# Patient Record
Sex: Female | Born: 1990 | Race: White | Hispanic: No | Marital: Married | Smoking: Never smoker
Health system: Southern US, Community
[De-identification: ages and names within clinical notes are randomized; demographics above are authoritative.]

## PROBLEM LIST (undated history)

## (undated) ENCOUNTER — Inpatient Hospital Stay (HOSPITAL_COMMUNITY): Payer: Self-pay

---

## 2013-04-09 HISTORY — PX: WISDOM TOOTH EXTRACTION: SHX21

## 2018-06-09 ENCOUNTER — Encounter (HOSPITAL_COMMUNITY): Payer: Self-pay | Admitting: *Deleted

## 2018-06-09 ENCOUNTER — Inpatient Hospital Stay (HOSPITAL_COMMUNITY): Payer: 59

## 2018-06-09 ENCOUNTER — Inpatient Hospital Stay (HOSPITAL_COMMUNITY)
Admission: AD | Admit: 2018-06-09 | Discharge: 2018-06-09 | Disposition: A | Discharge status: Home / Self Care | Payer: 59 | Attending: Obstetrics and Gynecology | Admitting: Obstetrics and Gynecology

## 2018-06-09 ENCOUNTER — Other Ambulatory Visit: Payer: Self-pay

## 2018-06-09 DIAGNOSIS — O26891 Other specified pregnancy related conditions, first trimester: Secondary | ICD-10-CM

## 2018-06-09 DIAGNOSIS — R109 Unspecified abdominal pain: Secondary | ICD-10-CM

## 2018-06-09 DIAGNOSIS — O209 Hemorrhage in early pregnancy, unspecified: Secondary | ICD-10-CM | POA: Diagnosis present

## 2018-06-09 DIAGNOSIS — O021 Missed abortion: Secondary | ICD-10-CM | POA: Insufficient documentation

## 2018-06-09 DIAGNOSIS — Z3A01 Less than 8 weeks gestation of pregnancy: Secondary | ICD-10-CM

## 2018-06-09 LAB — URINALYSIS, ROUTINE W REFLEX MICROSCOPIC
Bilirubin Urine: NEGATIVE
Glucose, UA: NEGATIVE mg/dL
Ketones, ur: NEGATIVE mg/dL
Nitrite: NEGATIVE
PH: 7 (ref 5.0–8.0)
Protein, ur: NEGATIVE mg/dL
Specific Gravity, Urine: 1.009 (ref 1.005–1.030)

## 2018-06-09 LAB — CBC WITH DIFFERENTIAL/PLATELET
Abs Immature Granulocytes: 0.03 10*3/uL (ref 0.00–0.07)
BASOS PCT: 0 %
Basophils Absolute: 0 10*3/uL (ref 0.0–0.1)
Eosinophils Absolute: 0.1 10*3/uL (ref 0.0–0.5)
Eosinophils Relative: 1 %
HCT: 43.1 % (ref 36.0–46.0)
Hemoglobin: 14.3 g/dL (ref 12.0–15.0)
Immature Granulocytes: 0 %
Lymphocytes Relative: 34 %
Lymphs Abs: 3.2 10*3/uL (ref 0.7–4.0)
MCH: 29.5 pg (ref 26.0–34.0)
MCHC: 33.2 g/dL (ref 30.0–36.0)
MCV: 89 fL (ref 80.0–100.0)
Monocytes Absolute: 0.6 10*3/uL (ref 0.1–1.0)
Monocytes Relative: 6 %
Neutro Abs: 5.6 10*3/uL (ref 1.7–7.7)
Neutrophils Relative %: 59 %
Platelets: 360 10*3/uL (ref 150–400)
RBC: 4.84 MIL/uL (ref 3.87–5.11)
RDW: 12.7 % (ref 11.5–15.5)
WBC: 9.5 10*3/uL (ref 4.0–10.5)
nRBC: 0 % (ref 0.0–0.2)

## 2018-06-09 LAB — WET PREP, GENITAL
Clue Cells Wet Prep HPF POC: NONE SEEN
SPERM: NONE SEEN
Trich, Wet Prep: NONE SEEN
Yeast Wet Prep HPF POC: NONE SEEN

## 2018-06-09 LAB — ABO/RH: ABO/RH(D): O POS

## 2018-06-09 LAB — POCT PREGNANCY, URINE: Preg Test, Ur: POSITIVE — AB

## 2018-06-09 LAB — HCG, QUANTITATIVE, PREGNANCY: hCG, Beta Chain, Quant, S: 14909 m[IU]/mL — ABNORMAL HIGH (ref <5)

## 2018-06-09 MED ORDER — PROMETHAZINE HCL 25 MG PO TABS: 12.5000 mg | ORAL_TABLET | Freq: Four times a day (QID) | ORAL | 0 refills | Status: DC | PRN

## 2018-06-09 MED ORDER — OXYCODONE-ACETAMINOPHEN 5-325 MG PO TABS: 1.0000 | ORAL_TABLET | ORAL | 0 refills | Status: AC | PRN

## 2018-06-09 MED ORDER — MISOPROSTOL 200 MCG PO TABS: ORAL_TABLET | ORAL | 1 refills | Status: DC

## 2018-06-09 MED ORDER — IBUPROFEN 600 MG PO TABS: 600.0000 mg | ORAL_TABLET | Freq: Four times a day (QID) | ORAL | 0 refills | Status: DC | PRN

## 2018-06-09 NOTE — MAU Note (Signed)
Pt reports positive preg test, spotting for the last 3 days. Some discomfort but not pain.

## 2018-06-09 NOTE — Discharge Instructions (Signed)
Care After Cytotec This sheet gives you information about how to care for yourself. Your health care provider may also give you more specific instructions. If you have problems or questions, contact your health care provider. What can I expect ? After the procedure, it is common to have:  Bleeding that lasts for a few hours or a few days. It may feel like you are having a heavy menstrual period.  A headache.  Diarrhea.  Nausea and vomiting.  Chills.  Dizziness. Your next period will most likely start 4-6 weeks after the procedure, unless you start taking birth control pills. Follow these instructions at home: Medicines   Take over-the-counter and prescription medicines only as told by your health care provider.  Only take the medicines your health care provider recommends. Do not take aspirin. It can cause bleeding. Activity  Do not have sex for 2-3 weeks or until your health care provider approves.  Rest and avoid activity that requires a lot of energy for 2-3 weeks.  Do not drive or use heavy machinery while taking prescription pain medicine. General instructions  There will be bleeding after the procedure. It is recommended that you: ? Write down how many menstrual pads you use each day and how soaked they are. This could be useful information for your health care provider. ? Check for any large blood clots or tissue when you change your menstrual pad. If you pass tissue, save the tissue to show to your health care provider.  Do not douche or use tampons until your health care provider approves.  Ask your health care provider when you can start using hormonal birth control (contraception).  Keep all follow-up visits as told by your health care provider. This is important. Contact a health care provider if:  You have chills or a fever.  You have pain that is not relieved by prescription pain medicine.  You have a bad-smelling vaginal discharge.  You have pain or  bleeding that gets worse instead of better.  You have any of the following for more than 24 hours: ? Nausea. ? Vomiting. ? Diarrhea. Get help right away if:  You have severe cramps in your stomach, back, or abdomen.  You pass large blood clots or tissue out of your vagina. Save any tissue for your health care provider to inspect.  You need to change your pad more than once in an hour.  You become light-headed, weak, or faint. Summary  It is common to have bleeding, headache, diarrhea, nausea and vomiting, chills, and dizziness.  Take over-the-counter and prescription medicines only as told by your health care provider.  Do not have sex for 2-3 weeks or until your health care provider approves.  When you are bleeding after the procedure, write down how many menstrual pads you use each day and how soaked they are.  Keep all follow-up visits as told by your health care provider. This is important. This information is not intended to replace advice given to you by your health care provider. Make sure you discuss any questions you have with your health care provider. Document Released: 03/31/2013 Document Revised: 06/13/2016 Document Reviewed: 06/13/2016 Elsevier Interactive Patient Education  2019 [employer].

## 2018-06-09 NOTE — MAU Provider Note (Addendum)
History     CSN: [id number]  Arrival date and time: 06/09/18 1107   First Provider Initiated Contact with Patient 06/09/18 1253     History obtained from patient.  Chief Complaint  Patient presents with  . Possible Pregnancy  . Vaginal Bleeding   HPI   Virginia Lewis [id number] is a 28 year old female that presents to MAU with a chief complaint of vaginal bleeding and mild abdominal cramping in LLQ that started on Saturday night. Patient reports that bleeding started off as dark brown, similar to "old blood," and then progressed to a light pink color. States that bleeding is less than her typical menstrual cycles. Last night at 11:00 pm she noticed 3 clots that were "the size of peas." Her bleeding improved, and then she noticed it again this morning when she wiped. Abdominal pain is rated 0.5/10 and is located on the left side. Patient has not taken anything for pain. LMP was on 04/29/2018. Unknown IUP. Denies discharge and dysuria. Denies history of STD. Denies history of abdominal surgeries. Patient is taking prenatal vitamins, and has an appointment scheduled for prenatal care on Wednesday.   History reviewed. No pertinent past medical history.  History reviewed. No pertinent surgical history.  History reviewed. No pertinent family history.  Social History   Tobacco Use  . Smoking status: Never Smoker  . Smokeless tobacco: Never Used  Substance Use Topics  . Alcohol use: Not Currently  . Drug use: Never    Allergies: No Known Allergies  No medications prior to admission.    Review of Systems  Respiratory: Negative for shortness of breath.   Cardiovascular: Negative for chest pain and leg swelling.  Gastrointestinal: Positive for abdominal pain (LLQ).  Genitourinary: Positive for vaginal bleeding. Negative for dysuria, flank pain, pelvic pain and vaginal pain.  Neurological: Negative for headaches.   Physical Exam   Blood pressure (!) 140/92, pulse (!) 104, temperature  98.4 F (36.9 C), temperature source Oral, resp. rate 16, height 5\' 3"  (1.6 m), weight 108.4 kg, last menstrual period 04/29/2018, SpO2 100 %.  Physical Exam  Constitutional: She is oriented to person, place, and time. She appears well-developed and well-nourished.  HENT:  Head: Normocephalic and atraumatic.  Neck: Normal range of motion. Neck supple.  Cardiovascular: Normal rate, regular rhythm and normal heart sounds.  Respiratory: Effort normal and breath sounds normal.  GI: Soft. Bowel sounds are normal. She exhibits no distension. There is no abdominal tenderness. There is no rebound and no guarding.  Neurological: She is alert and oriented to person, place, and time.  Skin: Skin is warm and dry.    Shawnie Dapper PA-S2  06/09/2018, 1:13 PM  I personally was present during the history, physical exam and medical decision-making activities of this service and have verified that the service and findings are accurately documented in the student's note.   Mallie Snooks, CNM 06/09/18  2:38 PM    MAU Course/MDM  Procedures: sterile speculum exam GU and physical assessment performed by me  --Crown rump length of 7.29mm with no cardiac activity  --Discussed radiology report with Dr. Durenda Guthrie. He agrees patient meets criteria for failed pregnancy (per ACOG criteria below) and will amend his final report to reflect this diagnosis  --Greater than 20 minutes spent at bedside discussing options for treatment including expectant management, Cytotec in MAU and Cytotec at home. Patient verbalizes strong preference for Cytotec at home after calling her husband. CNM reviewed expectations for timing and amount of bleeding  s/p Cytotec  --Chaplain declined by patient    Early Intrauterine Pregnancy Failure  _X__  Documented intrauterine pregnancy failure less than or equal to [redacted] weeks gestation  _X__  No serious current illness  _X_  Baseline Hgb greater than or equal to 10g/dl  _X__   Patient has easily accessible transportation to the hospital  _X__  Clear preference  _X__  Practitioner/physician deems patient reliable  _X__  Counseling by practitioner or physician  _X__  Patient education by RN  N/A  Rho-Gam given by RN if indicated (A POS)  The following medications have been electronically sent to patient's pharmacy  _X_   Cytotec 800 mcg to pharmacy, buccal or per vagina at home  _X__  Ibuprofen 600 mg 1 tablet by mouth every 6 hours as needed #30  _X_  Hydrocodone/acetaminophen 5/325 mg by mouth every 4 to 6 hours as needed  _X_  Phenergan 12.5 mg by mouth every 4 hours as needed for nausea  Results for orders placed or performed during the hospital encounter of 06/09/18 (from the past 24 hour(s))  Pregnancy, urine POC     Status: Abnormal   Collection Time: 06/09/18 12:07 PM  Result Value Ref Range   Preg Test, Ur POSITIVE (A) NEGATIVE  Urinalysis, Routine w reflex microscopic     Status: Abnormal   Collection Time: 06/09/18 12:11 PM  Result Value Ref Range   Color, Urine YELLOW YELLOW   APPearance HAZY (A) CLEAR   Specific Gravity, Urine 1.009 1.005 - 1.030   pH 7.0 5.0 - 8.0   Glucose, UA NEGATIVE NEGATIVE mg/dL   Hgb urine dipstick LARGE (A) NEGATIVE   Bilirubin Urine NEGATIVE NEGATIVE   Ketones, ur NEGATIVE NEGATIVE mg/dL   Protein, ur NEGATIVE NEGATIVE mg/dL   Nitrite NEGATIVE NEGATIVE   Leukocytes,Ua LARGE (A) NEGATIVE   RBC / HPF 0-5 0 - 5 RBC/hpf   WBC, UA 21-50 0 - 5 WBC/hpf   Bacteria, UA RARE (A) NONE SEEN   Squamous Epithelial / LPF 11-20 0 - 5  Wet prep, genital     Status: Abnormal   Collection Time: 06/09/18  1:12 PM  Result Value Ref Range   Yeast Wet Prep HPF POC NONE SEEN NONE SEEN   Trich, Wet Prep NONE SEEN NONE SEEN   Clue Cells Wet Prep HPF POC NONE SEEN NONE SEEN   WBC, Wet Prep HPF POC MANY (A) NONE SEEN   Sperm NONE SEEN   CBC with Differential/Platelet     Status: None   Collection Time: 06/09/18  1:29 PM   Result Value Ref Range   WBC 9.5 4.0 - 10.5 K/uL   RBC 4.84 3.87 - 5.11 MIL/uL   Hemoglobin 14.3 12.0 - 15.0 g/dL   HCT 43.1 36.0 - 46.0 %   MCV 89.0 80.0 - 100.0 fL   MCH 29.5 26.0 - 34.0 pg   MCHC 33.2 30.0 - 36.0 g/dL   RDW 12.7 11.5 - 15.5 %   Platelets 360 150 - 400 K/uL   nRBC 0.0 0.0 - 0.2 %   Neutrophils Relative % 59 %   Neutro Abs 5.6 1.7 - 7.7 K/uL   Lymphocytes Relative 34 %   Lymphs Abs 3.2 0.7 - 4.0 K/uL   Monocytes Relative 6 %   Monocytes Absolute 0.6 0.1 - 1.0 K/uL   Eosinophils Relative 1 %   Eosinophils Absolute 0.1 0.0 - 0.5 K/uL   Basophils Relative 0 %   Basophils Absolute 0.0  0.0 - 0.1 K/uL   Immature Granulocytes 0 %   Abs Immature Granulocytes 0.03 0.00 - 0.07 K/uL  hCG, quantitative, pregnancy     Status: Abnormal   Collection Time: 06/09/18  1:29 PM  Result Value Ref Range   hCG, Beta Chain, Quant, S 14,909 (H) <5 mIU/mL  ABO/Rh     Status: None   Collection Time: 06/09/18  1:29 PM  Result Value Ref Range   ABO/RH(D)      O POS Performed at [facility] [address] [place], [place] [address]      Assessment and Plan  --Virginia Lewis 28 y.o. G1P0 with non-viable pregnancy at [redacted]w[redacted]d by certain LMP --Per Radiology consult with Dr. Candise Che patient meets ACOG criteria for failed pregnancy. Amended final Radiology report in work --Patient to administer Cytotec at home. Patient's husband will be home with her  --Given work note --Discharge home in stable condition with bleeding precautions  F/U: Patient to have Repeat Quant hCG in one week, Provider visit in two weeks. Clinic messaged.   Mallie Snooks, CNM 06/09/18  4:31 PM

## 2018-06-10 LAB — GC/CHLAMYDIA PROBE AMP (~~LOC~~) NOT AT ARMC
Chlamydia: NEGATIVE
Neisseria Gonorrhea: NEGATIVE

## 2018-06-10 LAB — CULTURE, OB URINE: Special Requests: NORMAL

## 2018-06-12 ENCOUNTER — Other Ambulatory Visit: Payer: Self-pay | Admitting: *Deleted

## 2018-06-12 DIAGNOSIS — O021 Missed abortion: Secondary | ICD-10-CM

## 2018-06-16 ENCOUNTER — Ambulatory Visit: Payer: 59

## 2018-06-26 ENCOUNTER — Telehealth: Payer: Self-pay | Admitting: Family Medicine

## 2018-06-26 NOTE — Telephone Encounter (Signed)
Left a VM about after hours clinic.

## 2018-06-27 ENCOUNTER — Telehealth: Payer: Self-pay | Admitting: Obstetrics & Gynecology

## 2018-06-27 NOTE — Telephone Encounter (Signed)
The patient called in to cancel her appointment. She stated she followed up with her regual doctor and stated she will continue care with her original obgyn.

## 2018-06-30 ENCOUNTER — Ambulatory Visit: Payer: 59 | Admitting: Obstetrics and Gynecology

## 2018-06-30 ENCOUNTER — Ambulatory Visit: Payer: 59 | Admitting: Advanced Practice Midwife

## 2019-02-05 LAB — OB RESULTS CONSOLE GC/CHLAMYDIA
Chlamydia: NEGATIVE
Gonorrhea: NEGATIVE

## 2019-02-05 LAB — OB RESULTS CONSOLE VARICELLA ZOSTER ANTIBODY, IGG: Varicella: IMMUNE

## 2019-02-05 LAB — OB RESULTS CONSOLE RUBELLA ANTIBODY, IGM: Rubella: IMMUNE

## 2019-02-05 LAB — OB RESULTS CONSOLE HEPATITIS B SURFACE ANTIGEN: Hepatitis B Surface Ag: NEGATIVE

## 2019-04-10 NOTE — L&D Delivery Note (Signed)
Delivery Note Pt pushed for about an hour for delivery.  At 5:24 PM a viable and healthy female was delivered via Vaginal, Spontaneous (Presentation: Left Occiput Anterior/LOT).  APGAR: 9, 9; weight  P.   Placenta status: Spontaneous, Intact.  Cord: 3 vessels with the following complications: None.    Anesthesia: Epidural Episiotomy: None Lacerations: 2nd degree;Vaginal;Sulcus Suture Repair: 3.0 vicryl rapide Est. Blood Loss (mL): 356cc  Mom to postpartum.  Baby to Couplet care / Skin to Skin.  Sadie Hazelett Bovard-Stuckert 08/27/2019, 6:04 PM  O+/RI/Tdap in PNC/Br/Contra ?  D/W parents circumcision for female infant including r/b/a

## 2019-06-10 LAB — OB RESULTS CONSOLE HIV ANTIBODY (ROUTINE TESTING): HIV: NONREACTIVE

## 2019-06-10 LAB — OB RESULTS CONSOLE RPR: RPR: NONREACTIVE

## 2019-08-12 LAB — OB RESULTS CONSOLE GBS: GBS: NEGATIVE

## 2019-08-25 ENCOUNTER — Other Ambulatory Visit (HOSPITAL_COMMUNITY)
Admission: RE | Admit: 2019-08-25 | Discharge: 2019-08-25 | Disposition: A | Discharge status: Home / Self Care | Payer: BC Managed Care – PPO | Source: Ambulatory Visit | Attending: Family Medicine | Admitting: Family Medicine

## 2019-08-25 ENCOUNTER — Other Ambulatory Visit: Payer: Self-pay | Admitting: Advanced Practice Midwife

## 2019-08-25 DIAGNOSIS — Z01812 Encounter for preprocedural laboratory examination: Secondary | ICD-10-CM | POA: Insufficient documentation

## 2019-08-25 DIAGNOSIS — Z20822 Contact with and (suspected) exposure to covid-19: Secondary | ICD-10-CM | POA: Insufficient documentation

## 2019-08-25 LAB — SARS CORONAVIRUS 2 (TAT 6-24 HRS): SARS Coronavirus 2: NEGATIVE

## 2019-08-26 ENCOUNTER — Other Ambulatory Visit: Payer: Self-pay | Admitting: Obstetrics and Gynecology

## 2019-08-27 ENCOUNTER — Encounter (HOSPITAL_COMMUNITY): Payer: Self-pay | Admitting: Obstetrics and Gynecology

## 2019-08-27 ENCOUNTER — Inpatient Hospital Stay (HOSPITAL_COMMUNITY): Payer: BC Managed Care – PPO

## 2019-08-27 ENCOUNTER — Other Ambulatory Visit: Payer: Self-pay

## 2019-08-27 ENCOUNTER — Inpatient Hospital Stay (HOSPITAL_COMMUNITY): Payer: BC Managed Care – PPO | Admitting: Anesthesiology

## 2019-08-27 ENCOUNTER — Inpatient Hospital Stay (HOSPITAL_COMMUNITY)
Admission: AD | Admit: 2019-08-27 | Discharge: 2019-08-29 | DRG: 807 | Disposition: A | Discharge status: Home / Self Care | Payer: BC Managed Care – PPO | Attending: Obstetrics and Gynecology | Admitting: Obstetrics and Gynecology

## 2019-08-27 DIAGNOSIS — Z3A38 38 weeks gestation of pregnancy: Secondary | ICD-10-CM

## 2019-08-27 DIAGNOSIS — O134 Gestational [pregnancy-induced] hypertension without significant proteinuria, complicating childbirth: Secondary | ICD-10-CM | POA: Diagnosis present

## 2019-08-27 DIAGNOSIS — O3413 Maternal care for benign tumor of corpus uteri, third trimester: Secondary | ICD-10-CM | POA: Diagnosis present

## 2019-08-27 DIAGNOSIS — Z3493 Encounter for supervision of normal pregnancy, unspecified, third trimester: Secondary | ICD-10-CM

## 2019-08-27 DIAGNOSIS — D259 Leiomyoma of uterus, unspecified: Secondary | ICD-10-CM | POA: Diagnosis present

## 2019-08-27 DIAGNOSIS — O99214 Obesity complicating childbirth: Secondary | ICD-10-CM | POA: Diagnosis present

## 2019-08-27 DIAGNOSIS — Z20822 Contact with and (suspected) exposure to covid-19: Secondary | ICD-10-CM | POA: Diagnosis present

## 2019-08-27 LAB — CBC
HCT: 35.7 % — ABNORMAL LOW (ref 36.0–46.0)
Hemoglobin: 11.6 g/dL — ABNORMAL LOW (ref 12.0–15.0)
MCH: 27.9 pg (ref 26.0–34.0)
MCHC: 32.5 g/dL (ref 30.0–36.0)
MCV: 85.8 fL (ref 80.0–100.0)
Platelets: 289 10*3/uL (ref 150–400)
RBC: 4.16 MIL/uL (ref 3.87–5.11)
RDW: 13.7 % (ref 11.5–15.5)
WBC: 10.4 10*3/uL (ref 4.0–10.5)
nRBC: 0 % (ref 0.0–0.2)

## 2019-08-27 LAB — TYPE AND SCREEN
ABO/RH(D): O POS
Antibody Screen: NEGATIVE

## 2019-08-27 LAB — RPR: RPR Ser Ql: NONREACTIVE

## 2019-08-27 LAB — PLATELET COUNT: Platelets: 264 10*3/uL (ref 150–400)

## 2019-08-27 MED ORDER — FENTANYL-BUPIVACAINE-NACL 0.5-0.125-0.9 MG/250ML-% EP SOLN
12.0000 mL/h | EPIDURAL | Status: DC | PRN
  Filled 2019-08-27: qty 250

## 2019-08-27 MED ORDER — DIBUCAINE (PERIANAL) 1 % EX OINT: 1.0000 "application " | TOPICAL_OINTMENT | CUTANEOUS | Status: DC | PRN

## 2019-08-27 MED ORDER — EPHEDRINE 5 MG/ML INJ: 10.0000 mg | INTRAVENOUS | Status: DC | PRN

## 2019-08-27 MED ORDER — DIPHENHYDRAMINE HCL 50 MG/ML IJ SOLN: 12.5000 mg | INTRAMUSCULAR | Status: DC | PRN

## 2019-08-27 MED ORDER — PHENYLEPHRINE 40 MCG/ML (10ML) SYRINGE FOR IV PUSH (FOR BLOOD PRESSURE SUPPORT): 80.0000 ug | PREFILLED_SYRINGE | INTRAVENOUS | Status: DC | PRN

## 2019-08-27 MED ORDER — SIMETHICONE 80 MG PO CHEW: 80.0000 mg | CHEWABLE_TABLET | ORAL | Status: DC | PRN

## 2019-08-27 MED ORDER — ONDANSETRON HCL 4 MG/2ML IJ SOLN: 4.0000 mg | INTRAMUSCULAR | Status: DC | PRN

## 2019-08-27 MED ORDER — LACTATED RINGERS IV SOLN: INTRAVENOUS | Status: DC

## 2019-08-27 MED ORDER — ACETAMINOPHEN 325 MG PO TABS: 650.0000 mg | ORAL_TABLET | ORAL | Status: DC | PRN

## 2019-08-27 MED ORDER — LIDOCAINE HCL (PF) 1 % IJ SOLN
INTRAMUSCULAR | Status: DC | PRN
  Administered 2019-08-27: 11 mL via EPIDURAL

## 2019-08-27 MED ORDER — ZOLPIDEM TARTRATE 5 MG PO TABS: 5.0000 mg | ORAL_TABLET | Freq: Every evening | ORAL | Status: DC | PRN

## 2019-08-27 MED ORDER — MISOPROSTOL 50MCG HALF TABLET
50.0000 ug | ORAL_TABLET | Freq: Three times a day (TID) | ORAL | Status: DC | PRN
  Administered 2019-08-27: 50 ug via ORAL
  Filled 2019-08-27 (×2): qty 2

## 2019-08-27 MED ORDER — WITCH HAZEL-GLYCERIN EX PADS: 1.0000 "application " | MEDICATED_PAD | CUTANEOUS | Status: DC | PRN

## 2019-08-27 MED ORDER — BUTORPHANOL TARTRATE 1 MG/ML IJ SOLN
1.0000 mg | INTRAMUSCULAR | Status: DC | PRN
  Administered 2019-08-27: 1 mg via INTRAVENOUS
  Filled 2019-08-27: qty 1

## 2019-08-27 MED ORDER — OXYCODONE HCL 5 MG PO TABS: 5.0000 mg | ORAL_TABLET | ORAL | Status: DC | PRN

## 2019-08-27 MED ORDER — IBUPROFEN 600 MG PO TABS
600.0000 mg | ORAL_TABLET | Freq: Four times a day (QID) | ORAL | Status: DC
  Administered 2019-08-27 – 2019-08-29 (×7): 600 mg via ORAL
  Filled 2019-08-27 (×7): qty 1

## 2019-08-27 MED ORDER — BENZOCAINE-MENTHOL 20-0.5 % EX AERO
1.0000 "application " | INHALATION_SPRAY | CUTANEOUS | Status: DC | PRN
  Administered 2019-08-27: 1 via TOPICAL
  Filled 2019-08-27: qty 56

## 2019-08-27 MED ORDER — LACTATED RINGERS IV SOLN: 500.0000 mL | INTRAVENOUS | Status: DC | PRN

## 2019-08-27 MED ORDER — ONDANSETRON HCL 4 MG/2ML IJ SOLN
4.0000 mg | Freq: Four times a day (QID) | INTRAMUSCULAR | Status: DC | PRN
  Administered 2019-08-27: 4 mg via INTRAVENOUS
  Filled 2019-08-27: qty 2

## 2019-08-27 MED ORDER — OXYTOCIN 40 UNITS IN NORMAL SALINE INFUSION - SIMPLE MED: 2.5000 [IU]/h | INTRAVENOUS | Status: DC

## 2019-08-27 MED ORDER — ONDANSETRON HCL 4 MG PO TABS: 4.0000 mg | ORAL_TABLET | ORAL | Status: DC | PRN

## 2019-08-27 MED ORDER — SODIUM CHLORIDE (PF) 0.9 % IJ SOLN
INTRAMUSCULAR | Status: DC | PRN
  Administered 2019-08-27: 12 mL/h via EPIDURAL

## 2019-08-27 MED ORDER — PHENYLEPHRINE 40 MCG/ML (10ML) SYRINGE FOR IV PUSH (FOR BLOOD PRESSURE SUPPORT)
80.0000 ug | PREFILLED_SYRINGE | INTRAVENOUS | Status: DC | PRN
  Filled 2019-08-27: qty 10

## 2019-08-27 MED ORDER — OXYCODONE-ACETAMINOPHEN 5-325 MG PO TABS: 1.0000 | ORAL_TABLET | ORAL | Status: DC | PRN

## 2019-08-27 MED ORDER — DIPHENHYDRAMINE HCL 25 MG PO CAPS: 25.0000 mg | ORAL_CAPSULE | Freq: Four times a day (QID) | ORAL | Status: DC | PRN

## 2019-08-27 MED ORDER — OXYTOCIN 40 UNITS IN NORMAL SALINE INFUSION - SIMPLE MED
1.0000 m[IU]/min | INTRAVENOUS | Status: DC
  Administered 2019-08-27: 2 m[IU]/min via INTRAVENOUS
  Filled 2019-08-27: qty 1000

## 2019-08-27 MED ORDER — COCONUT OIL OIL: 1.0000 "application " | TOPICAL_OIL | Status: DC | PRN

## 2019-08-27 MED ORDER — LACTATED RINGERS IV SOLN
500.0000 mL | Freq: Once | INTRAVENOUS | Status: AC
  Administered 2019-08-27: 500 mL via INTRAVENOUS

## 2019-08-27 MED ORDER — PRENATAL 27-0.8 MG PO TABS: 1.0000 | ORAL_TABLET | Freq: Every day | ORAL | Status: DC

## 2019-08-27 MED ORDER — OXYCODONE-ACETAMINOPHEN 5-325 MG PO TABS: 2.0000 | ORAL_TABLET | ORAL | Status: DC | PRN

## 2019-08-27 MED ORDER — SOD CITRATE-CITRIC ACID 500-334 MG/5ML PO SOLN: 30.0000 mL | ORAL | Status: DC | PRN

## 2019-08-27 MED ORDER — LIDOCAINE HCL (PF) 1 % IJ SOLN
30.0000 mL | INTRAMUSCULAR | Status: AC | PRN
  Administered 2019-08-27: 30 mL via SUBCUTANEOUS
  Filled 2019-08-27: qty 30

## 2019-08-27 MED ORDER — MISOPROSTOL 25 MCG QUARTER TABLET
25.0000 ug | ORAL_TABLET | Freq: Three times a day (TID) | ORAL | Status: DC | PRN
  Administered 2019-08-27: 25 ug via VAGINAL
  Filled 2019-08-27: qty 1

## 2019-08-27 MED ORDER — OXYCODONE HCL 5 MG PO TABS: 10.0000 mg | ORAL_TABLET | ORAL | Status: DC | PRN

## 2019-08-27 MED ORDER — OXYTOCIN BOLUS FROM INFUSION
500.0000 mL | Freq: Once | INTRAVENOUS | Status: AC
  Administered 2019-08-27: 500 mL via INTRAVENOUS

## 2019-08-27 MED ORDER — SENNOSIDES-DOCUSATE SODIUM 8.6-50 MG PO TABS
2.0000 | ORAL_TABLET | ORAL | Status: DC
  Administered 2019-08-27 – 2019-08-28 (×2): 2 via ORAL
  Filled 2019-08-27 (×2): qty 2

## 2019-08-27 MED ORDER — PRENATAL MULTIVITAMIN CH
1.0000 | ORAL_TABLET | Freq: Every day | ORAL | Status: DC
  Administered 2019-08-28 – 2019-08-29 (×2): 1 via ORAL
  Filled 2019-08-27 (×2): qty 1

## 2019-08-27 MED ORDER — TERBUTALINE SULFATE 1 MG/ML IJ SOLN: 0.2500 mg | Freq: Once | INTRAMUSCULAR | Status: DC | PRN

## 2019-08-27 NOTE — Progress Notes (Signed)
Patient ID: Virginia Lewis, female   DOB: 12/07/1990, 29 y.o.   MRN: [id number]  No c/o's, cytotec overnight - got some rest.    AFVSS gen NAD FHTs 140-150's, mod var, category 1, + accels toco q 2-3 min  SVE 2.3/60/-2  AROM for clear fluid, minimal, clear  Continue current mgmt. Add pitocin prn.

## 2019-08-27 NOTE — H&P (Signed)
Maclovia Lu is a 29 y.o. female G2P0010 at 53+ for IOL given PIH.  Relatively uncomplicated PNC.  Pt with obesity.  Pt failed glucola - 1 elevated value on 3 hr GTT, repeat WNL.  Panorama low risk.  Received Tdap in [facility].  Pregnancy dated by LMP.  Normal PIH labs.    OB History    Gravida  2   Para      Term      Preterm      AB      Living        SAB      TAB      Ectopic      Multiple      Live Births            G1 SAB G2 present  No abn pap, last '20 No STD  History reviewed. No pertinent past medical history. Past Surgical History:  Procedure Laterality Date  . WISDOM TOOTH EXTRACTION  2015   Family History: family history includes COPD in her father; Cancer in her maternal grandfather and maternal grandmother; Heart disease in her father; Stroke in her maternal grandfather and maternal grandmother. Social History:  reports that she has never smoked. She has never used smokeless tobacco. She reports previous alcohol use. She reports that she does not use drugs.  Dental assistant, married  Meds Folic Acid and PNV All NKDA     Maternal Diabetes: No Genetic Screening: Normal Maternal Ultrasounds/Referrals: Normal Fetal Ultrasounds or other Referrals:  None Maternal Substance Abuse:  No Significant Maternal Medications:  None Significant Maternal Lab Results:  Group B Strep negative Other Comments:  None  Review of Systems  Constitutional: Negative.   HENT: Negative.   Eyes: Negative.   Respiratory: Negative.   Cardiovascular: Negative.   Gastrointestinal: Negative.   Genitourinary: Negative.   Musculoskeletal: Negative.   Skin: Negative.   Neurological: Negative.   Psychiatric/Behavioral: Negative.    Maternal Medical History:  Contractions: Frequency: irregular.    Fetal activity: Perceived fetal activity is normal.    Prenatal complications: Pre-eclampsia (PIH).   Prenatal Complications - Diabetes: none.    Dilation: 1.5 Effacement  (%): 50 Station: -3 Exam by:: K. Cowher RN Blood pressure 138/85, pulse 95, temperature 98.4 F (36.9 C), temperature source Oral, resp. rate 18, height 5\' 3"  (1.6 m), weight 114.3 kg, unknown if currently breastfeeding. Maternal Exam:  Abdomen: Patient reports no abdominal tenderness. Fundal height is appropriate for gestation.   Estimated fetal weight is 7-8#.   Fetal presentation: vertex  Introitus: Normal vulva. Normal vagina.    Physical Exam  Constitutional: She is oriented to person, place, and time. She appears well-developed and well-nourished.  HENT:  Head: Normocephalic and atraumatic.  Cardiovascular: Normal rate and regular rhythm.  Respiratory: Effort normal and breath sounds normal. No respiratory distress. She has no wheezes.  GI: Soft. Bowel sounds are normal. She exhibits no distension. There is no abdominal tenderness.  Genitourinary:    Vulva normal.   Musculoskeletal:        General: Normal range of motion.  Neurological: She is alert and oriented to person, place, and time.  Skin: Skin is warm and dry.  Psychiatric: She has a normal mood and affect. Her behavior is normal.    Prenatal labs: ABO, Rh: --/--/O POS (05/20 0035) Antibody: NEG (05/20 0035) Rubella: Immune (10/29 0000) RPR: Nonreactive (03/03 0000)  HBsAg: Negative (10/29 0000)  HIV: Non-reactive (03/03 0000)  GBS: Negative/-- (05/05 0000)  Hgb 13.1/pLT 374/uR cX NEG/gc NEG/cHL NEG/vARICELLA IMMUNE/hGB ELECTRO wnl/hEP c NEG/GLUCOLA 139 - 1 elevated 3hr GTT/repeat 3hr GTT WNL.    [place] sm ant fibroid, nl limited anat, post plac, female F/u nl anat  Assessment/Plan: 29yo G2P0010 at 38+ with PIH for IOL cytotec O/N, AROM/pitocin today Epidural, IV pain meds or nitrous prn Expect SVD Monitor BP closely   Maribel Luis Bovard-Stuckert 08/27/2019, 9:10 AM

## 2019-08-27 NOTE — Plan of Care (Signed)

## 2019-08-27 NOTE — Progress Notes (Signed)
Patient ID: Virginia Lewis, female   DOB: 1990/12/31, 29 y.o.   MRN: [id number]   Comfortable w epidural  AFVSS gen NAD FHTs 140's, mod var, + accels, category 1 toco Q 2-61min  SVE 4.5/90/0  Continue current mgmt

## 2019-08-27 NOTE — Anesthesia Preprocedure Evaluation (Signed)
Anesthesia Evaluation  Patient identified by MRN, date of birth, ID band Patient awake    Reviewed: Allergy & Precautions, NPO status , Patient's Chart, lab work & pertinent test results  Airway Mallampati: II  TM Distance: >3 FB Neck ROM: Full    Dental no notable dental hx.    Pulmonary neg pulmonary ROS,    Pulmonary exam normal breath sounds clear to auscultation       Cardiovascular negative cardio ROS Normal cardiovascular exam Rhythm:Regular Rate:Normal     Neuro/Psych negative neurological ROS  negative psych ROS   GI/Hepatic negative GI ROS, Neg liver ROS,   Endo/Other  Morbid obesity  Renal/GU negative Renal ROS  negative genitourinary   Musculoskeletal negative musculoskeletal ROS (+)   Abdominal (+) + obese,   Peds negative pediatric ROS (+)  Hematology negative hematology ROS (+)   Anesthesia Other Findings   Reproductive/Obstetrics (+) Pregnancy                             Anesthesia Physical Anesthesia Plan  ASA: III  Anesthesia Plan: Epidural   Post-op Pain Management:    Induction:   PONV Risk Score and Plan:   Airway Management Planned:   Additional Equipment:   Intra-op Plan:   Post-operative Plan:   Informed Consent: I have reviewed the patients History and Physical, chart, labs and discussed the procedure including the risks, benefits and alternatives for the proposed anesthesia with the patient or authorized representative who has indicated his/her understanding and acceptance.       Plan Discussed with:   Anesthesia Plan Comments:         Anesthesia Quick Evaluation

## 2019-08-27 NOTE — Anesthesia Procedure Notes (Signed)
Epidural Patient location during procedure: OB Start time: 08/27/2019 12:37 PM End time: 08/27/2019 12:44 PM  Staffing Anesthesiologist: Lynda Rainwater, MD Performed: anesthesiologist   Preanesthetic Checklist Completed: patient identified, IV checked, site marked, risks and benefits discussed, surgical consent, monitors and equipment checked, pre-op evaluation and timeout performed  Epidural Patient position: sitting Prep: ChloraPrep Patient monitoring: heart rate, cardiac monitor, continuous pulse ox and blood pressure Approach: midline Location: L2-L3 Injection technique: LOR saline  Needle:  Needle type: Tuohy  Needle gauge: 17 G Needle length: 9 cm Needle insertion depth: 9 cm Catheter type: closed end flexible Catheter size: 20 Guage Catheter at skin depth: 14 cm Test dose: negative  Assessment Events: blood not aspirated, injection not painful, no injection resistance, no paresthesia and negative IV test  Additional Notes Reason for block:procedure for pain

## 2019-08-28 LAB — CBC
HCT: 30.7 % — ABNORMAL LOW (ref 36.0–46.0)
Hemoglobin: 9.7 g/dL — ABNORMAL LOW (ref 12.0–15.0)
MCH: 27.4 pg (ref 26.0–34.0)
MCHC: 31.6 g/dL (ref 30.0–36.0)
MCV: 86.7 fL (ref 80.0–100.0)
Platelets: 234 10*3/uL (ref 150–400)
RBC: 3.54 MIL/uL — ABNORMAL LOW (ref 3.87–5.11)
RDW: 14 % (ref 11.5–15.5)
WBC: 15 10*3/uL — ABNORMAL HIGH (ref 4.0–10.5)
nRBC: 0 % (ref 0.0–0.2)

## 2019-08-28 NOTE — Anesthesia Postprocedure Evaluation (Signed)
Anesthesia Post Note  Patient: Virginia Lewis  Procedure(s) Performed: AN AD HOC LABOR EPIDURAL     Anesthesia Post Evaluation  Last Vitals:  Vitals:   08/28/19 0125 08/28/19 0504  BP: 129/62 107/65  Pulse: 91 88  Resp: 20 20  Temp: 37 C 36.7 C  SpO2: 98% 98%    Last Pain:  Vitals:   08/28/19 0504  TempSrc: Oral  PainSc: 0-No pain   Pain Goal:                   [name]

## 2019-08-28 NOTE — Lactation Note (Signed)
This note was copied from a baby's chart. Lactation Consultation Note  Patient Name: Virginia Lewis [id number] Date: 08/28/2019 Reason for consult: Initial assessment;Early term 37-38.6wks;Mother's request;Other (Comment)([facility] placed the order at 16 hours)  Baby is 55 hours old  As LC entered the room. Baby latched with depth / football / swallows noted and per mom  Comfortable. Nipple appeared well rounded when baby released.  LC noted some areola edema and as a preventive measure instructed mom on the use  Of the shells between feedings except when sleeping.  Mom reports positive breast changes with pregnancy 1st trimester.  Baby had a sluggish start breast feeding and a DEBP was set up by the [facility].  Mom has been pumping with EBM yield.  LC recommended since the baby has fed and is now asleep, to place a top on the container And save for the next feeding to spoon feed for appetizer or dessert.  Baby has been picking up with feedings this at 519 am, 0935 and 1330 ,and voids.Malvin Johns QS for age.  LC reviewed potential feeding behaviors for an early term infant and provided the ( LPT information ) and the [facility] pamphlet with phone numbers.  Per mom has a DEBP ( Medela ) at home.   Maternal Data Has patient been taught Hand Expression?: Yes  Feeding Feeding Type: (baby latched with depth/ swallowws noted and per mom comfortable)  LATCH Score Latch: (latched on the left breast with depth)  Audible Swallowing: (swallows noted and increased with breast compressions)  Type of Nipple: (nipple well rounded when baby released)  Comfort (Breast/Nipple): (per mom comfortable)  Hold (Positioning): (RN helped with latch)  LATCH Score: 8  Interventions Interventions: Breast feeding basics reviewed;Shells;Hand pump;DEBP  Lactation Tools Discussed/Used Tools: Pump;Shells;Flanges(LC instructed mom on the use shells between feedings except when sleeping) Flange Size: 24;27 Shell Type:  Inverted Breast pump type: Double-Electric Breast Pump;Manual WIC Program: No Pump Review: Milk Storage   Consult Status Consult Status: Follow-up Date: 08/29/19 Follow-up type: In-patient    [place] 08/28/2019, 2:23 PM

## 2019-08-28 NOTE — Anesthesia Postprocedure Evaluation (Signed)
Anesthesia Post Note  Patient: Virginia Lewis  Procedure(s) Performed: AN AD HOC LABOR EPIDURAL     Patient location during evaluation: Mother Baby Anesthesia Type: Epidural Level of consciousness: awake and alert Pain management: pain level controlled Vital Signs Assessment: post-procedure vital signs reviewed and stable Respiratory status: spontaneous breathing, nonlabored ventilation and respiratory function stable Cardiovascular status: stable Postop Assessment: no headache, no backache and epidural receding Anesthetic complications: no    Last Vitals:  Vitals:   08/28/19 0125 08/28/19 0504  BP: 129/62 107/65  Pulse: 91 88  Resp: 20 20  Temp: 37 C 36.7 C  SpO2: 98% 98%    Last Pain:  Vitals:   08/28/19 0504  TempSrc: Oral  PainSc: 0-No pain   Pain Goal:                   Heidi Maclin

## 2019-08-28 NOTE — Progress Notes (Signed)
Post Partum Day 1 Subjective: no complaints and tolerating PO  Working on breastfeeding  Objective: Blood pressure 107/65, pulse 88, temperature 98 F (36.7 C), temperature source Oral, resp. rate 20, height 5\' 3"  (1.6 m), weight 114.3 kg, SpO2 98 %, unknown if currently breastfeeding.  Physical Exam:  General: alert and cooperative Lochia: appropriate Uterine Fundus: firm   Recent Labs    08/27/19 0038 08/28/19 0536  HGB 11.6* 9.7*  HCT 35.7* 30.7*    Assessment/Plan: Plan for discharge tomorrow  Baby still not latching well, so will hold on circumcision until tomorrow   LOS: 1 day   Logan Bores 08/28/2019, 9:37 AM

## 2019-08-29 MED ORDER — IBUPROFEN 600 MG PO TABS: 600.0000 mg | ORAL_TABLET | Freq: Four times a day (QID) | ORAL | 0 refills | Status: AC

## 2019-08-29 MED ORDER — ACETAMINOPHEN 325 MG PO TABS: 650.0000 mg | ORAL_TABLET | ORAL | 1 refills | Status: AC | PRN

## 2019-08-29 NOTE — Lactation Note (Signed)
This note was copied from a baby's chart. Lactation Consultation Note  Patient Name: Virginia Lewis [id number] Date: 08/29/2019 Reason for consult: Follow-up assessment   Baby 9 hours old and out of room for circ.  Mother is pumping on one drop.  LC increased to 3 drops.  Mother states it is comfortable. Mother is supplementing at the breast with SNS and states it is working well. Suggest parents call when baby feeds for LC to view feed. Mother has DEBP at home.     Maternal Data Has patient been taught Hand Expression?: Yes  Feeding Feeding Type: Breast Milk with Formula added  LATCH Score                   Interventions Interventions: DEBP  Lactation Tools Discussed/Used     Consult Status Consult Status: Follow-up Date: 08/29/19 Follow-up type: In-patient    [name] [facility] 08/29/2019, 9:25 AM

## 2019-08-29 NOTE — Progress Notes (Signed)
Parent asked if they can give baby formula that was sitting on the counter from previous shift, unopened. Parents think baby is hungry and want to see if baby would calm down after the formula.  RN noticed baby was sucking on the pacifier when walked in.  Parents have been educated about pacifier use and cluster feeding.  Parents have been informed of small tummy size of newborn, taught hand expression and understand the possible consequences of formula to the health of the infant. The possible consequences shared with patient include 1) Loss of confidence in breastfeeding 2) Engorgement 3) Allergic sensitization of baby(asthma/allergies) and 4) decreased milk supply for mother. After discussion of the above the mother decided to supplement with similac 20 cal.

## 2019-08-29 NOTE — Lactation Note (Addendum)
This note was copied from a baby's chart. Lactation Consultation Note  Patient Name: Virginia Lewis [id number] Date: 08/29/2019 Reason for consult: Mother's request;Difficult latch;Follow-up assessment P1, 34 hour ETI female infant. LC entered room and saw formula on sink  counter per parents, they started supplementing with formula. Per mom, infant is not sustaining latch and been having difficulties with breastfeeding, infant BF for short intervals 5 or 7 minutes.  LC discussed with mom to supplement infant at breast using 5 french feeding tube, continue to work on infant latching at breast, instead of giving infant formula in a bottle, mom open to [facility] suggestion. LC explain to dad, how to use 5 french tube and to assist mom with feedings. Mom will continue to use DEBP every 3 hours to help establish her milk supply.  Mom latched infant on her right breast with 5 french tube ( supplementing infant with 8 mls of formula), infant latched after few attempts and sustained latch, swallows heard and infant pulled formula from tube himself with latch. Only few times LC pushed syringe of 5 [place] feeding tube, infant sustained latch and breastfed for 18 minutes. Parents were please that infant sustained latch, per mom she could feel tug on her breast as infant was suckling. Mom knows to call RN or LC if she needs further assistance with latching infant at breast.  Mom will continue to latch infant at breast, will try a few feeds without 5 french tube or supplement.   Maternal Data    Feeding Feeding Type: Breast Milk with Formula added Nipple Type: Slow - flow  LATCH Score Latch: Grasps breast easily, tongue down, lips flanged, rhythmical sucking.  Audible Swallowing: Spontaneous and intermittent  Type of Nipple: Everted at rest and after stimulation(short shafted)  Comfort (Breast/Nipple): Filling, red/small blisters or bruises, mild/mod discomfort  Hold (Positioning): Assistance needed to  correctly position infant at breast and maintain latch.  LATCH Score: 8  Interventions Interventions: Assisted with latch;Skin to skin;Support pillows;Adjust position;Position options;Breast compression;DEBP;Expressed milk;Pre-pump if needed;Breast massage  Lactation Tools Discussed/Used Tools: Pump   Consult Status Consult Status: Follow-up Date: 08/29/19 Follow-up type: In-patient    [name] 08/29/2019, 3:39 AM

## 2019-08-29 NOTE — Discharge Summary (Signed)
Postpartum Discharge Summary       Patient Name: Virginia Lewis DOB: October 08, 1990 MRN: [id number]  Date of admission: 08/27/2019 Delivery date:08/27/2019  Delivering provider: Janyth Contes  Date of discharge: 08/29/2019  Admitting diagnosis: Normal pregnancy in third trimester [Z34.93] Intrauterine pregnancy: [redacted]w[redacted]d     Secondary diagnosis:  Principal Problem:   SVD (spontaneous vaginal delivery) Active Problems:   Normal pregnancy in third trimester  Additional problems: none    Discharge diagnosis: Term Pregnancy Delivered                                                        Gestational hypertension  Post partum procedures:none Augmentation: AROM, Pitocin and Cytotec Complications: None  Hospital course: Induction of Labor With Vaginal Delivery   29 y.o. yo G2P1001 at [redacted]w[redacted]d was admitted to the hospital 08/27/2019 for induction of labor.  Indication for induction: Gestational hypertension.  Patient had an uncomplicated labor course as follows: Membrane Rupture Time/Date: 9:31 AM ,08/27/2019   Delivery Method:Vaginal, Spontaneous  Episiotomy: None  Lacerations:  2nd degree;Vaginal;Sulcus  Details of delivery can be found in separate delivery note.  Patient had a routine postpartum course and her blood pressures remained completely normal post-delivery. Patient is discharged home 08/29/19.  Newborn Data: Birth date:08/27/2019  Birth time:5:24 PM  Gender:Female  Living status:Living  Apgars:9 ,9  Weight:3190 g   Magnesium Sulfate received: No BMZ received: No   Physical exam  Vitals:   08/28/19 0926 08/28/19 1503 08/28/19 2154 08/29/19 0542  BP: 118/64 120/83 124/80 129/78  Pulse: 97 (!) 101 92 79  Resp: 18 18 18 18   Temp: 98.2 F (36.8 C) 98.6 F (37 C) 97.8 F (36.6 C) 97.8 F (36.6 C)  TempSrc: Oral Oral Oral Oral  SpO2:  99%    Weight:      Height:       General: alert and cooperative Lochia: appropriate Uterine Fundus: firm  Labs: Lab Results   Component Value Date   WBC 15.0 (H) 08/28/2019   HGB 9.7 (L) 08/28/2019   HCT 30.7 (L) 08/28/2019   MCV 86.7 08/28/2019   PLT 234 08/28/2019   No flowsheet data found. Edinburgh Score: Edinburgh Postnatal Depression Scale Screening Tool 08/28/2019  I have been able to laugh and see the funny side of things. 0  I have looked forward with enjoyment to things. 0  I have blamed myself unnecessarily when things went wrong. 2  I have been anxious or worried for no good reason. 2  I have felt scared or panicky for no good reason. 2  Things have been getting on top of me. 1  I have been so unhappy that I have had difficulty sleeping. 0  I have felt sad or miserable. 1  I have been so unhappy that I have been crying. 0  The thought of harming myself has occurred to me. 0  Edinburgh Postnatal Depression Scale Total 8     After visit meds:  Allergies as of 08/29/2019   No Known Allergies     Medication List    STOP taking these medications   promethazine 25 MG tablet Commonly known as: PHENERGAN     TAKE these medications   acetaminophen 325 MG tablet Commonly known as: Tylenol Take 2 tablets (650 mg total) by mouth  every 4 (four) hours as needed (for pain scale < 4). What changed:   medication strength  how much to take  when to take this  reasons to take this   ibuprofen 600 MG tablet Commonly known as: ADVIL Take 1 tablet (600 mg total) by mouth every 6 (six) hours.   multivitamin-prenatal 27-0.8 MG Tabs tablet Take 1 tablet by mouth daily at 12 noon.        Discharge home in stable condition Infant Feeding: Breast Infant Disposition:home with mother Discharge instruction: per After Visit Summary and Postpartum booklet. Activity: Advance as tolerated. Pelvic rest for 6 weeks.  Diet: routine diet Future Appointments:No future appointments. Follow up Visit: Follow-up Information    Bovard-Stuckert, Jody, MD. Schedule an appointment as soon as possible for a  visit in 6 week(s).   Specialty: Obstetrics and Gynecology Contact information: [place] [place] [address] [phone]            Please schedule this patient for a In person postpartum visit in 6 weeks with the following provider: MD.  Delivery mode:  Vaginal, Spontaneous  Anticipated Birth Control:  Condoms   08/29/2019 Virginia Bores, MD

## 2019-08-29 NOTE — Progress Notes (Signed)
Post Partum Day 2 Subjective: no complaints, up ad lib and tolerating PO   Breastfeeding going much better  Objective: Blood pressure 129/78, pulse 79, temperature 97.8 F (36.6 C), temperature source Oral, resp. rate 18, height 5\' 3"  (1.6 m), weight 114.3 kg, SpO2 99 %, unknown if currently breastfeeding.  Physical Exam:  General: alert and cooperative Lochia: appropriate Uterine Fundus: firm  Recent Labs    08/27/19 0038 08/28/19 0536  HGB 11.6* 9.7*  HCT 35.7* 30.7*    Assessment/Plan: Discharge home   LOS: 2 days   Logan Bores 08/29/2019, 8:52 AM

## 2020-01-31 IMAGING — US US OB < 14 WEEKS - US OB TV
1 series · 14 of 28 positions shown · non-contrast
Comparison: None.
COMPARISON: None.
COMPARISON: None.

Addendum:
CLINICAL DATA: Left-sided pelvic pain and vaginal bleeding.
Positive urinary pregnancy test.

EXAM:
OBSTETRIC <14 WK US AND TRANSVAGINAL OB US
TECHNIQUE: Both transabdominal and transvaginal ultrasound examinations were
performed for complete evaluation of the gestation as well as the
maternal uterus, adnexal regions, and pelvic cul-de-sac.
Transvaginal technique was performed to assess early pregnancy.

[Series 1: us ob < 14 weeks - us ob tv · 14 of 49 slices shown]
[im 2/49]
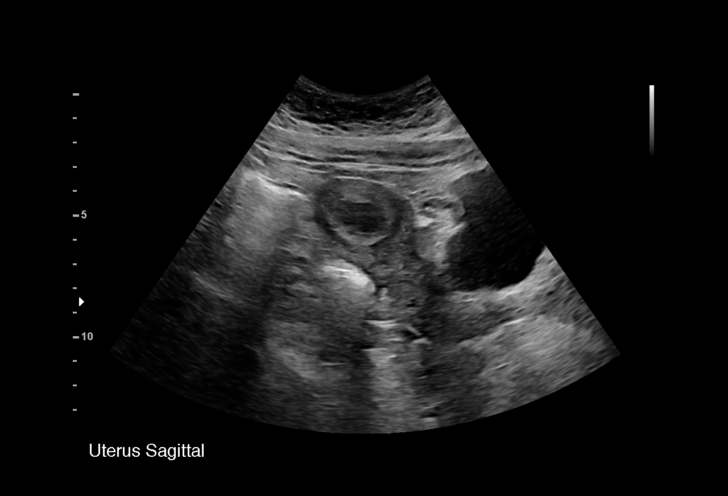
[im 6/49]
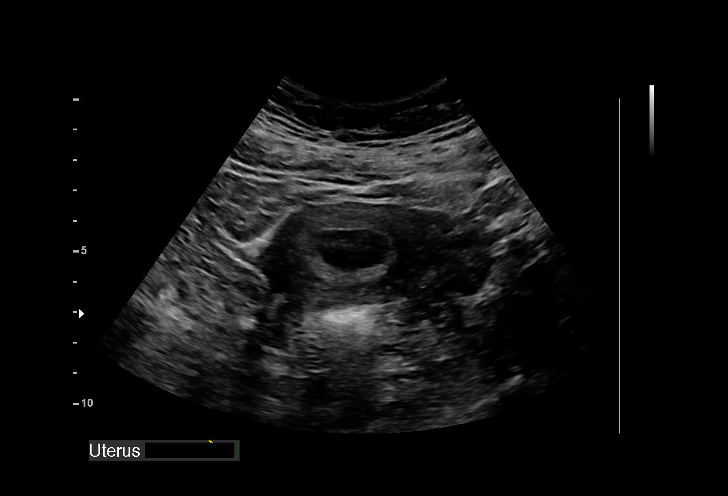
[im 9/49]
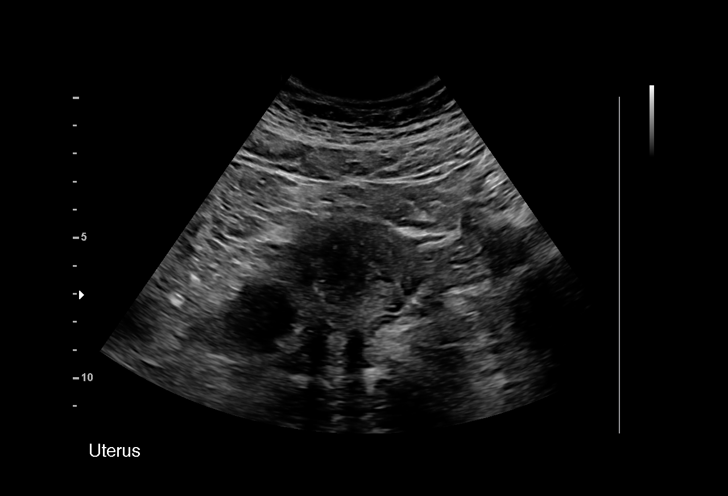
[im 13/49]
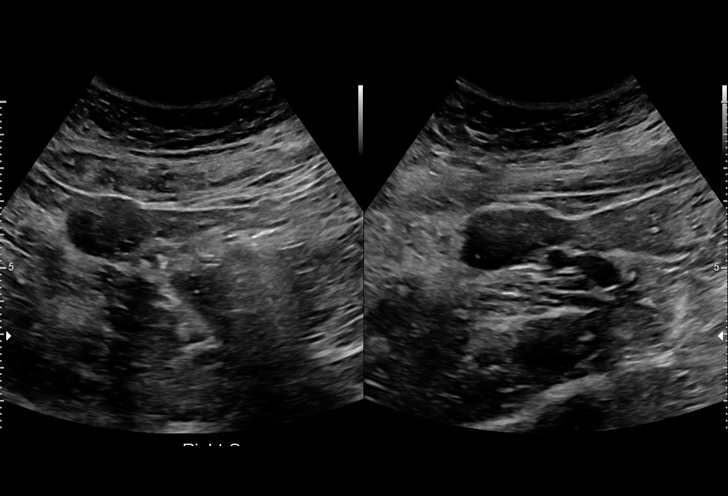
[im 17/49]
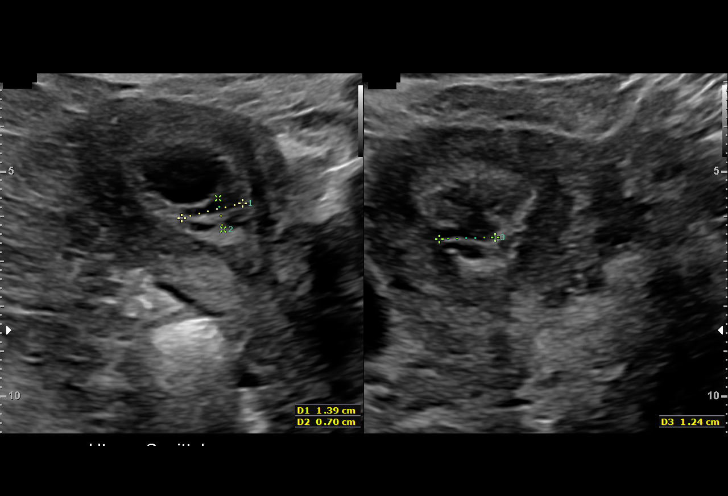
[im 20/49]
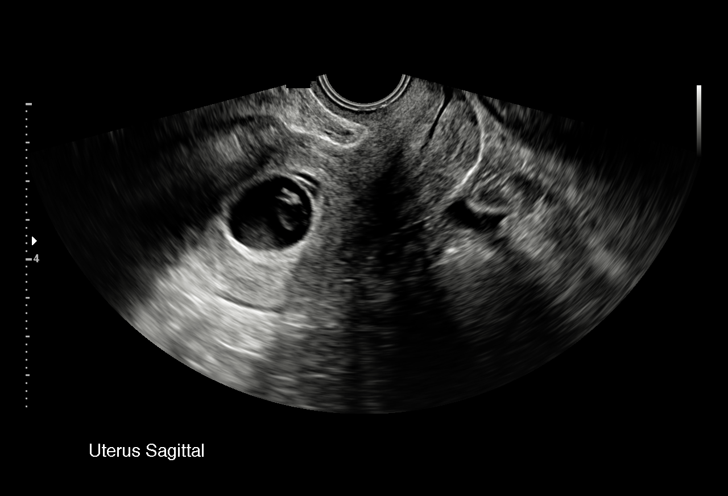
[im 24/49]
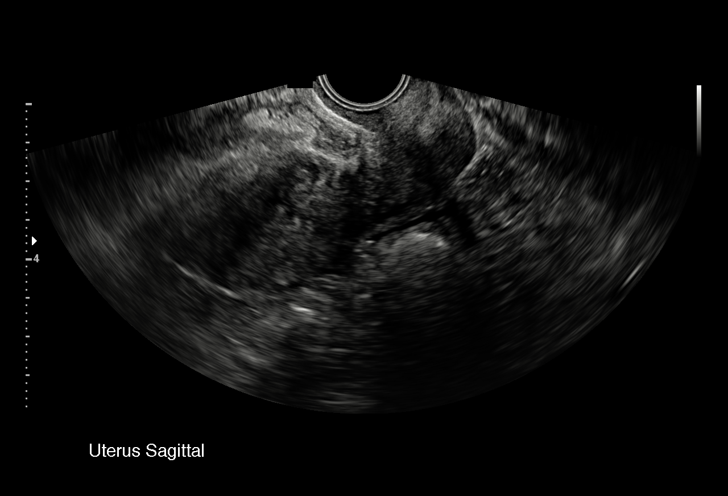
[im 27/49]
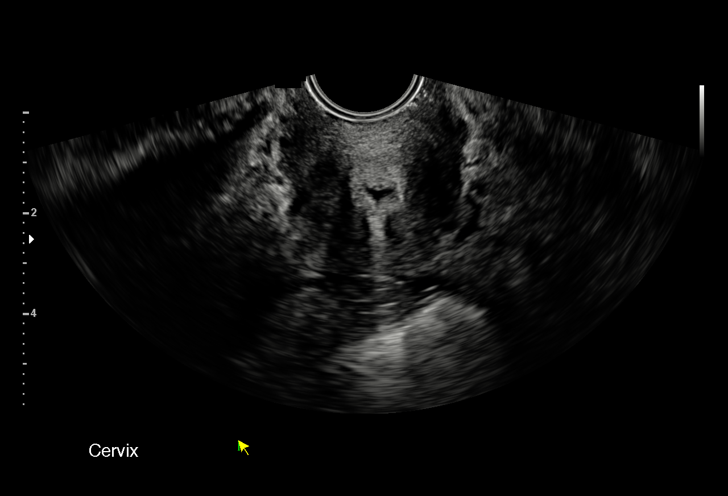
[im 31/49]
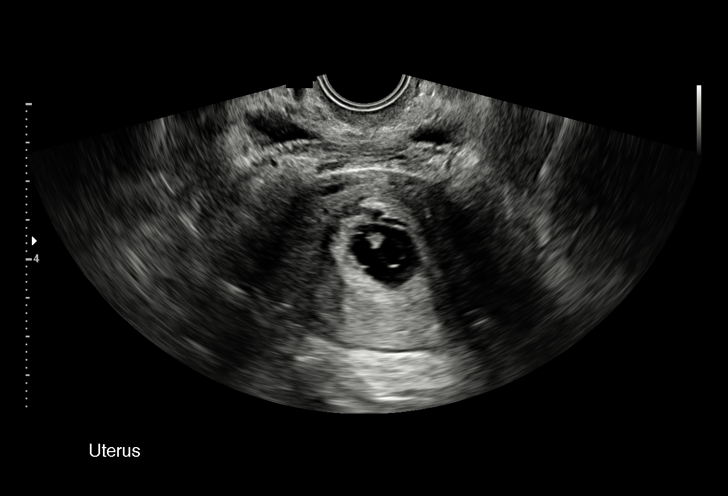
[im 34/49]
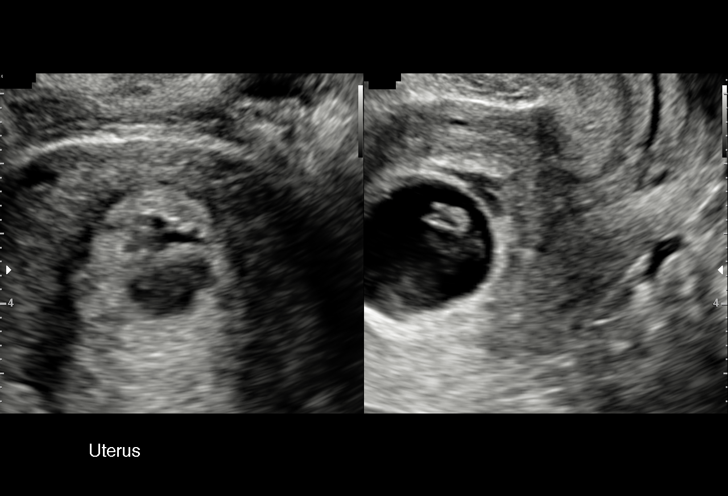
[im 38/49]
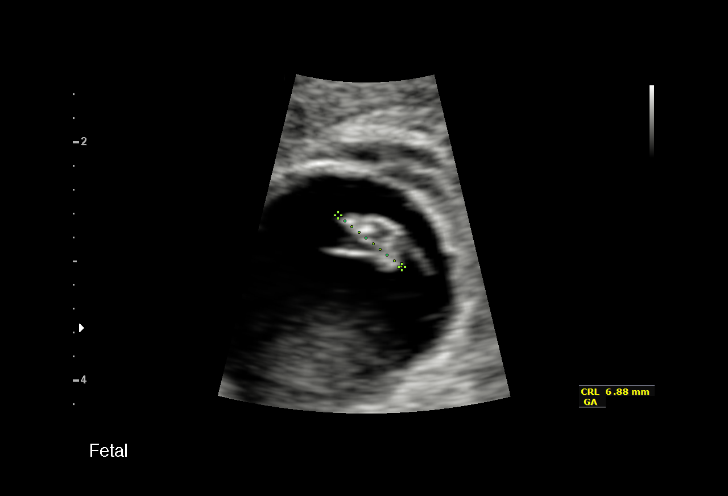
[im 41/49]
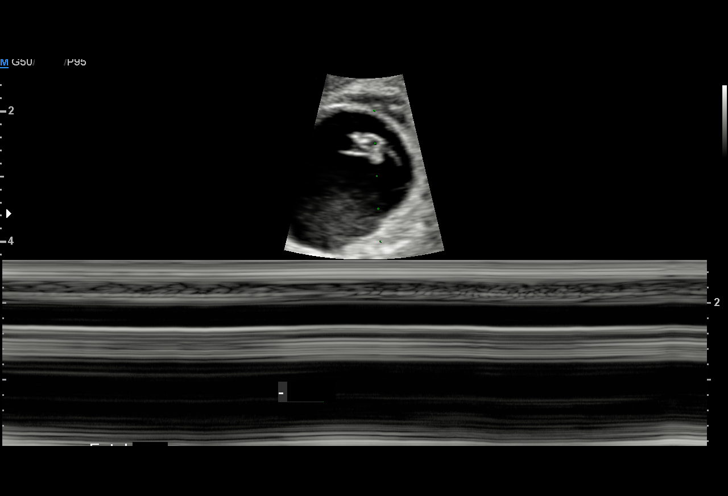
[im 45/49]
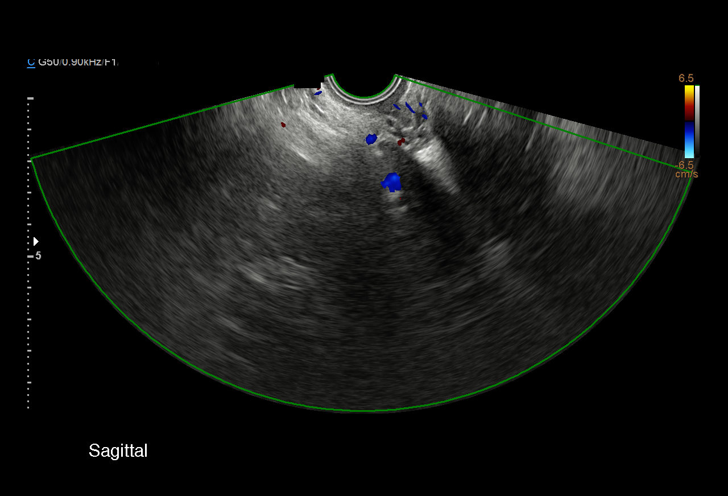
[im 49/49]
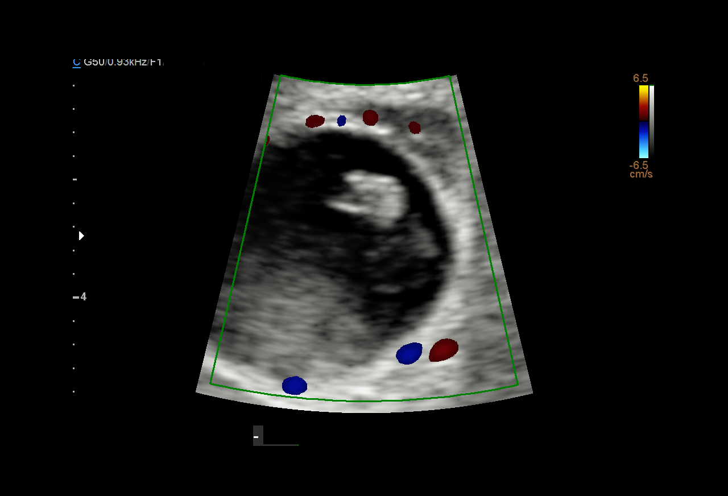

[14 of 28 positions shown; findings below may reference images not displayed]

FINDINGS: Intrauterine gestational sac: Present

Yolk sac:  None

Embryo:  Present

Cardiac Activity: None

Heart Rate: N/A bpm

CRL:  7.1 mm   6 w   4 d                  US EDC: 01/29/2019

Subchorionic hemorrhage:  Small amount

Maternal uterus/adnexae: Normal ovaries. Small amount of fluid noted
in the endocervical canal.
IMPRESSION: 1. Intrauterine gestational sac with a 7 week 1 day embryo. However,
no cardiac activity is demonstrated suggesting non-viability.
2. Small amount of subchorionic hemorrhage.
3. Normal ovaries.

ADDENDUM:
Sonographic findings are consistent with non viability of the
embryo.

*** End of Addendum ***
Addendum:
FINDINGS: Intrauterine gestational sac: Present

Yolk sac:  None

Embryo:  Present

Cardiac Activity: None

Heart Rate: N/A bpm

CRL:  7.1 mm   6 w   4 d                  US EDC: 01/29/2019

Subchorionic hemorrhage:  Small amount

Maternal uterus/adnexae: Normal ovaries. Small amount of fluid noted
in the endocervical canal.
IMPRESSION: 1. Intrauterine gestational sac with a 7 week 1 day embryo. However,
no cardiac activity is demonstrated suggesting non-viability.
2. Small amount of subchorionic hemorrhage.
3. Normal ovaries.

ADDENDUM:
Sonographic findings are consistent with non viability of the
embryo.

*** End of Addendum ***
FINDINGS: Intrauterine gestational sac: Present

Yolk sac:  None

Embryo:  Present

Cardiac Activity: None

Heart Rate: N/A bpm

CRL:  7.1 mm   6 w   4 d                  US EDC: 01/29/2019

Subchorionic hemorrhage:  Small amount

Maternal uterus/adnexae: Normal ovaries. Small amount of fluid noted
in the endocervical canal.
IMPRESSION: 1. Intrauterine gestational sac with a 7 week 1 day embryo. However,
no cardiac activity is demonstrated suggesting non-viability.
2. Small amount of subchorionic hemorrhage.
3. Normal ovaries.

## 2020-04-30 ENCOUNTER — Ambulatory Visit
Admission: RE | Admit: 2020-04-30 | Discharge: 2020-04-30 | Disposition: A | Discharge status: Home / Self Care | Payer: BC Managed Care – PPO | Source: Ambulatory Visit | Attending: Family Medicine | Admitting: Family Medicine

## 2020-04-30 ENCOUNTER — Other Ambulatory Visit: Payer: Self-pay

## 2020-04-30 VITALS — BP 147/85 | HR 108 | Temp 97.9°F | Resp 18 | Ht 63.0 in | Wt 250.0 lb

## 2020-04-30 DIAGNOSIS — H66001 Acute suppurative otitis media without spontaneous rupture of ear drum, right ear: Secondary | ICD-10-CM | POA: Diagnosis not present

## 2020-04-30 MED ORDER — AMOXICILLIN-POT CLAVULANATE 875-125 MG PO TABS: 1.0000 | ORAL_TABLET | Freq: Two times a day (BID) | ORAL | 0 refills | Status: AC

## 2020-04-30 NOTE — Discharge Instructions (Signed)
I have sent in Augmentin for you to take twice a day for 7 days.  Follow up with this office or with primary care if symptoms are persisting.  Follow up in the ER for high fever, trouble swallowing, trouble breathing, other concerning symptoms.  

## 2020-04-30 NOTE — ED Triage Notes (Signed)
Right ear pain since yesterday

## 2020-04-30 NOTE — ED Provider Notes (Signed)
[facility]   [id number] 04/30/20 Arrival Time: 9470  CC: EAR PAIN  SUBJECTIVE: History from: patient.  Virginia Lewis is a 30 y.o. female who presents with nasal congestion, right otalgia and sore throat x 2 days. Patient states the pain is constant and achy in character. Patient has not taken OTC medications for this. Symptoms are made worse with lying down. Reports similar symptoms in the past. Denies fever, chills, fatigue, sinus pain, rhinorrhea, ear discharge, SOB, wheezing, chest pain, nausea, changes in bowel or bladder habits.    ROS: As per HPI.  All other pertinent ROS negative.     Past Medical History:  Diagnosis Date  . SVD (spontaneous vaginal delivery) 08/27/2019   Past Surgical History:  Procedure Laterality Date  . WISDOM TOOTH EXTRACTION  2015   No Known Allergies No current facility-administered medications on file prior to encounter.   Current Outpatient Medications on File Prior to Encounter  Medication Sig Dispense Refill  . acetaminophen (TYLENOL) 325 MG tablet Take 2 tablets (650 mg total) by mouth every 4 (four) hours as needed (for pain scale < 4). 30 tablet 1  . ibuprofen (ADVIL) 600 MG tablet Take 1 tablet (600 mg total) by mouth every 6 (six) hours. 30 tablet 0  . Prenatal Vit-Fe Fumarate-FA (MULTIVITAMIN-PRENATAL) 27-0.8 MG TABS tablet Take 1 tablet by mouth daily at 12 noon.     Social History   Socioeconomic History  . Marital status: Married    Spouse name: Not on file  . Number of children: Not on file  . Years of education: Not on file  . Highest education level: Not on file  Occupational History  . Not on file  Tobacco Use  . Smoking status: Never Smoker  . Smokeless tobacco: Never Used  Vaping Use  . Vaping Use: Never used  Substance and Sexual Activity  . Alcohol use: Not Currently  . Drug use: Never  . Sexual activity: Yes  Other Topics Concern  . Not on file  Social History Narrative  . Not on file   Social  Determinants of Health   Financial Resource Strain: Not on file  Food Insecurity: Not on file  Transportation Needs: Not on file  Physical Activity: Not on file  Stress: Not on file  Social Connections: Not on file  Intimate Partner Violence: Not on file   Family History  Problem Relation Age of Onset  . COPD Father   . Heart disease Father   . Cancer Maternal Grandmother   . Stroke Maternal Grandmother   . Cancer Maternal Grandfather   . Stroke Maternal Grandfather     OBJECTIVE:  Vitals:   04/30/20 1152  BP: (!) 147/85  Pulse: (!) 108  Resp: 18  Temp: 97.9 F (36.6 C)  TempSrc: Oral  SpO2: 96%  Weight: 250 lb (113.4 kg)  Height: 5\' 3"  (1.6 m)     General appearance: alert; appears fatigued HEENT: Ears: EACs clear, L TM pearly gray with visible cone of light, without erythema, R TM erythematous, bulging, with effusion; Eyes: PERRL, EOMI grossly; Sinuses nontender to palpation; Nose: clear rhinorrhea; Throat: oropharynx mildly erythematous, tonsils 1+ without white tonsillar exudates, uvula midline Neck: supple without LAD Lungs: unlabored respirations, symmetrical air entry; cough: absent; no respiratory distress Heart: regular rate and rhythm.  Radial pulses 2+ symmetrical bilaterally Skin: warm and dry Psychological: alert and cooperative; normal mood and affect  Imaging: No results found.   ASSESSMENT & PLAN:  1. Non-recurrent acute  suppurative otitis media of right ear without spontaneous rupture of tympanic membrane     Meds ordered this encounter  Medications  . amoxicillin-clavulanate (AUGMENTIN) 875-125 MG tablet    Sig: Take 1 tablet by mouth 2 (two) times daily for 7 days.    Dispense:  14 tablet    Refill:  0    Order Specific Question:   Supervising Provider    Answer:   Chase Picket [[id number]]    Rest and drink plenty of fluids Prescribed augmentin 875 BID for 7 days  Take medications as directed and to completion Continue to use OTC  ibuprofen and/ or tylenol as needed for pain control Follow up with PCP if symptoms persists Return here or go to the ER if you have any new or worsening symptoms   Reviewed expectations re: course of current medical issues. Questions answered. Outlined signs and symptoms indicating need for more acute intervention. Patient verbalized understanding. After Visit Summary given.         Faustino Congress, NP 04/30/20 1203

## 2021-04-01 ENCOUNTER — Ambulatory Visit
Admission: RE | Admit: 2021-04-01 | Discharge: 2021-04-01 | Disposition: A | Discharge status: Home / Self Care | Payer: BC Managed Care – PPO | Source: Ambulatory Visit

## 2021-04-01 ENCOUNTER — Other Ambulatory Visit: Payer: Self-pay

## 2021-04-01 VITALS — BP 138/92 | HR 99 | Temp 97.8°F | Resp 18

## 2021-04-01 DIAGNOSIS — J069 Acute upper respiratory infection, unspecified: Secondary | ICD-10-CM

## 2021-04-01 DIAGNOSIS — Z20822 Contact with and (suspected) exposure to covid-19: Secondary | ICD-10-CM

## 2021-04-01 NOTE — ED Triage Notes (Signed)
Dry cough for over a month.  States the cough is causing her to vomit.  Body aches and head congestion x 3 days.  Patient is [redacted] weeks pregnant.

## 2021-04-01 NOTE — Discharge Instructions (Signed)
COVID/flu test pending Recommend Zyrtec and Flonase Can take Delsym for cough as needed If symptoms become worse return for evaluation

## 2021-04-01 NOTE — ED Provider Notes (Signed)
[facility]    CSN: [id number] Arrival date & time: 04/01/21  0946      History   Chief Complaint No chief complaint on file.   HPI Virginia Lewis is a 30 y.o. female.   Pt is [redacted] weeks pregnant. Complains of three days of congestion, body aches, and headaches.  She reports a cough, but she has experienced a dry cough over the last month.  Reports coughing fits have results in vomiting.  She has been taking tylenol, uncertain what to take while pregnant.  Denies fever, chills, n/v/d, shortness of breath, lower extremity swelling.    Past Medical History:  Diagnosis Date   SVD (spontaneous vaginal delivery) 08/27/2019    Patient Active Problem List   Diagnosis Date Noted   Normal pregnancy in third trimester 08/27/2019   SVD (spontaneous vaginal delivery) 08/27/2019    Past Surgical History:  Procedure Laterality Date   WISDOM TOOTH EXTRACTION  2015    OB History     Gravida  3   Para  1   Term  1   Preterm      AB      Living  1      SAB      IAB      Ectopic      Multiple  0   Live Births  1            Home Medications    Prior to Admission medications   Medication Sig Start Date End Date Taking? Authorizing Provider  aspirin EC 81 MG tablet Take 81 mg by mouth daily. Swallow whole.   Yes [provider]  acetaminophen (TYLENOL) 325 MG tablet Take 2 tablets (650 mg total) by mouth every 4 (four) hours as needed (for pain scale < 4). 08/29/19   Paula Compton, MD  ibuprofen (ADVIL) 600 MG tablet Take 1 tablet (600 mg total) by mouth every 6 (six) hours. 08/29/19   Paula Compton, MD  Prenatal Vit-Fe Fumarate-FA (MULTIVITAMIN-PRENATAL) 27-0.8 MG TABS tablet Take 1 tablet by mouth daily at 12 noon.    [provider]    Family History Family History  Problem Relation Age of Onset   COPD Father    Heart disease Father    Cancer Maternal Grandmother    Stroke Maternal Grandmother    Cancer Maternal  Grandfather    Stroke Maternal Grandfather     Social History Social History   Tobacco Use   Smoking status: Never   Smokeless tobacco: Never  Vaping Use   Vaping Use: Never used  Substance Use Topics   Alcohol use: Not Currently   Drug use: Never     Allergies   Patient has no known allergies.   Review of Systems Review of Systems  Constitutional:  Negative for chills and fever.  HENT:  Positive for congestion and postnasal drip. Negative for ear pain and sore throat.   Eyes:  Negative for pain and visual disturbance.  Respiratory:  Positive for cough. Negative for shortness of breath.   Cardiovascular:  Negative for chest pain and palpitations.  Gastrointestinal:  Negative for abdominal pain, diarrhea, nausea and vomiting.  Genitourinary:  Negative for dysuria and hematuria.  Musculoskeletal:  Negative for arthralgias and back pain.  Skin:  Negative for color change and rash.  Neurological:  Positive for headaches. Negative for seizures and syncope.  All other systems reviewed and are negative.   Physical Exam Triage Vital Signs ED Triage Vitals [  04/01/21 1000]  Enc Vitals Group     BP (!) 138/92     Pulse Rate 99     Resp 18     Temp 97.8 F (36.6 C)     Temp Source Oral     SpO2 98 %     Weight      Height      Head Circumference      Peak Flow      Pain Score 1     Pain Loc      Pain Edu?      Excl. in [place]?    No data found.  Updated Vital Signs BP (!) 138/92 (BP Location: Right Arm)    Pulse 99    Temp 97.8 F (36.6 C) (Oral)    Resp 18    LMP 04/12/2020    SpO2 98%    Breastfeeding No   Visual Acuity Right Eye Distance:   Left Eye Distance:   Bilateral Distance:    Right Eye Near:   Left Eye Near:    Bilateral Near:     Physical Exam Vitals and nursing note reviewed.  Constitutional:      General: She is not in acute distress.    Appearance: She is well-developed.  HENT:     Head: Normocephalic and atraumatic.  Eyes:      Conjunctiva/sclera: Conjunctivae normal.  Cardiovascular:     Rate and Rhythm: Normal rate and regular rhythm.     Heart sounds: No murmur heard. Pulmonary:     Effort: Pulmonary effort is normal. No respiratory distress.     Breath sounds: Normal breath sounds.  Abdominal:     Palpations: Abdomen is soft.     Tenderness: There is no abdominal tenderness.  Musculoskeletal:        General: No swelling.     Cervical back: Neck supple.  Skin:    General: Skin is warm and dry.     Capillary Refill: Capillary refill takes less than 2 seconds.  Neurological:     Mental Status: She is alert.  Psychiatric:        Mood and Affect: Mood normal.     UC Treatments / Results  Labs (all labs ordered are listed, but only abnormal results are displayed) Labs Reviewed  COVID-19, FLU A+B NAA    EKG   Radiology No results found.  Procedures Procedures (including critical care time)  Medications Ordered in UC Medications - No data to display  Initial Impression / Assessment and Plan / UC Course  I have reviewed the triage vital signs and the nursing notes.  Pertinent labs & imaging results that were available during my care of the patient were reviewed by me and considered in my medical decision making (see chart for details).     Reviewed medications that are safe in pregnancy.  Advised close follow up with OBGYN for blood pressure monitoring.  Supportive treatment of URI sx.  Return precautions discussed.  Final Clinical Impressions(s) / UC Diagnoses   Final diagnoses:  Exposure to COVID-19 virus  Viral upper respiratory tract infection     Discharge Instructions      COVID/flu test pending Recommend Zyrtec and Flonase Can take Delsym for cough as needed If symptoms become worse return for evaluation    ED Prescriptions   None    PDMP not reviewed this encounter.   Ward, Lenise Arena, PA-C 04/01/21 1043

## 2021-04-02 LAB — COVID-19, FLU A+B NAA
Influenza A, NAA: NOT DETECTED
Influenza B, NAA: NOT DETECTED
SARS-CoV-2, NAA: DETECTED — AB
